# Patient Record
Sex: Female | Born: 1935 | Race: White | Hispanic: No | Smoking: Former smoker
Health system: Southern US, Community
[De-identification: ages and names within clinical notes are randomized; demographics above are authoritative.]

## PROBLEM LIST (undated history)

## (undated) DIAGNOSIS — F01518 Vascular dementia, unspecified severity, with other behavioral disturbance: Secondary | ICD-10-CM

## (undated) DIAGNOSIS — E1129 Type 2 diabetes mellitus with other diabetic kidney complication: Secondary | ICD-10-CM

## (undated) DIAGNOSIS — N186 End stage renal disease: Secondary | ICD-10-CM

## (undated) DIAGNOSIS — I5022 Chronic systolic (congestive) heart failure: Secondary | ICD-10-CM

## (undated) DIAGNOSIS — F0151 Vascular dementia with behavioral disturbance: Secondary | ICD-10-CM

## (undated) HISTORY — DX: Vascular dementia, unspecified severity, with other behavioral disturbance: F01.518

## (undated) HISTORY — DX: Chronic systolic (congestive) heart failure: I50.22

## (undated) HISTORY — DX: Vascular dementia with behavioral disturbance: F01.51

## (undated) HISTORY — DX: Type 2 diabetes mellitus with other diabetic kidney complication: E11.29

## (undated) HISTORY — DX: End stage renal disease: N18.6

---

## 1998-11-05 HISTORY — PX: PACEMAKER INSERTION: SHX728

## 2004-04-04 ENCOUNTER — Ambulatory Visit: Payer: Self-pay | Admitting: Internal Medicine

## 2005-06-26 ENCOUNTER — Ambulatory Visit: Payer: Self-pay | Admitting: Nephrology

## 2007-08-05 ENCOUNTER — Other Ambulatory Visit: Payer: Self-pay

## 2007-08-06 ENCOUNTER — Observation Stay: Payer: Self-pay | Admitting: Internal Medicine

## 2008-09-30 ENCOUNTER — Ambulatory Visit: Payer: Self-pay | Admitting: Family Medicine

## 2008-10-06 ENCOUNTER — Ambulatory Visit: Payer: Self-pay | Admitting: Family Medicine

## 2010-03-14 ENCOUNTER — Inpatient Hospital Stay: Payer: Self-pay | Admitting: Internal Medicine

## 2010-04-16 ENCOUNTER — Emergency Department: Payer: Self-pay | Admitting: Internal Medicine

## 2011-05-11 IMAGING — US US CAROTID DUPLEX BILAT
1 series · 17 of 24 positions shown · non-contrast
Comparison: none

REASON FOR EXAM: cva
COMMENTS:

[Series 1: us carotid duplex bilat · 17 of 62 slices shown]
[im 1/62]
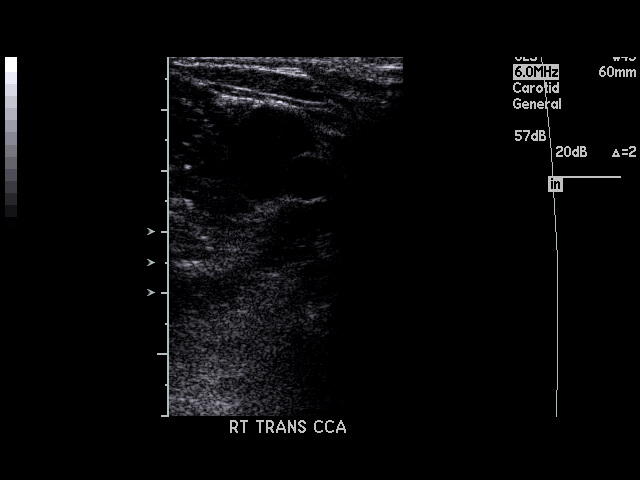
[im 6/62]
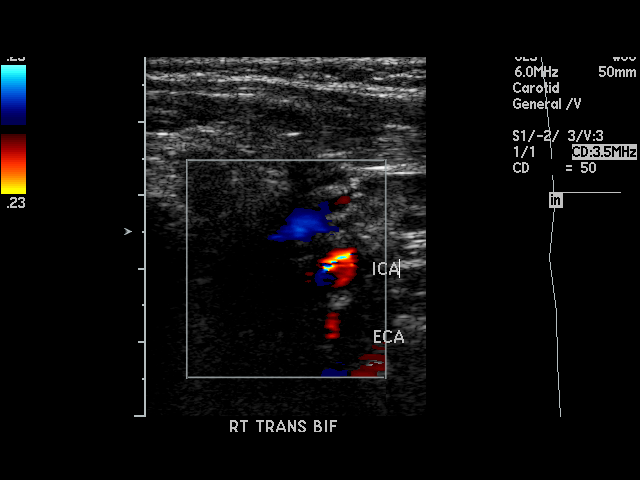
[im 8/62]
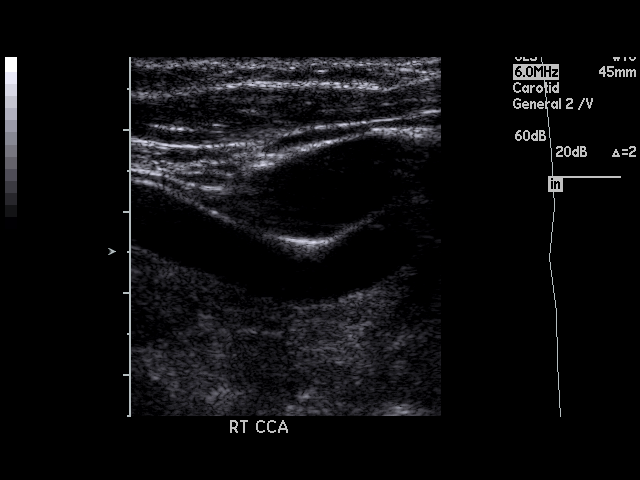
[im 11/62]
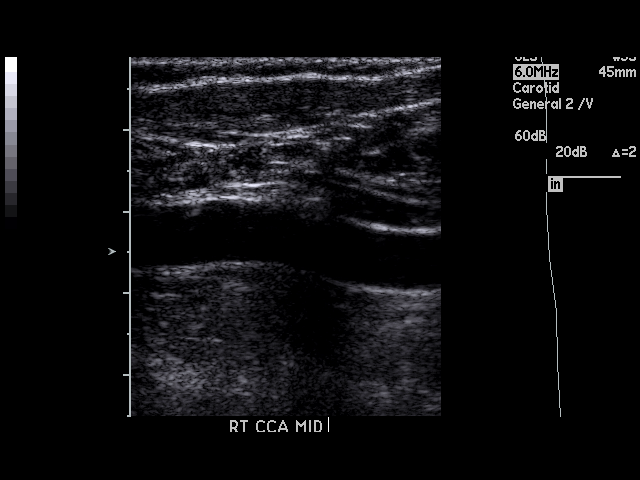
[im 16/62]
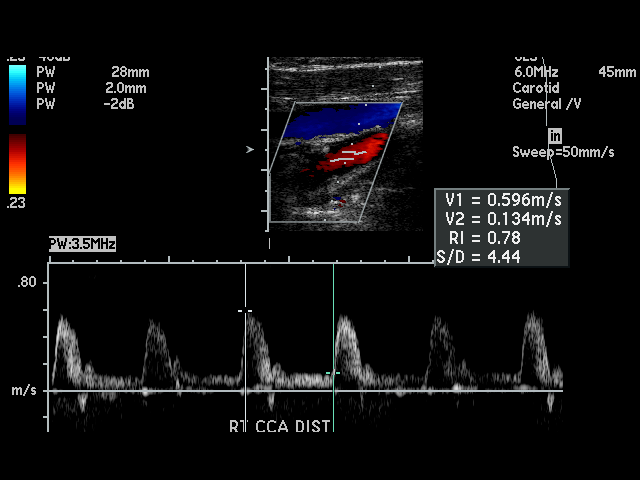
[im 19/62]
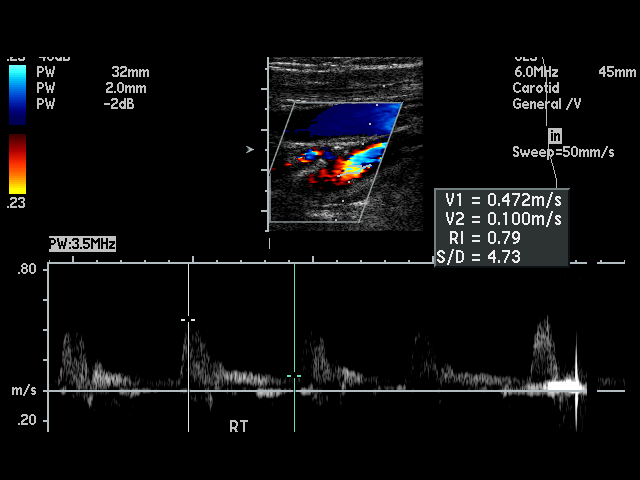
[im 24/62]
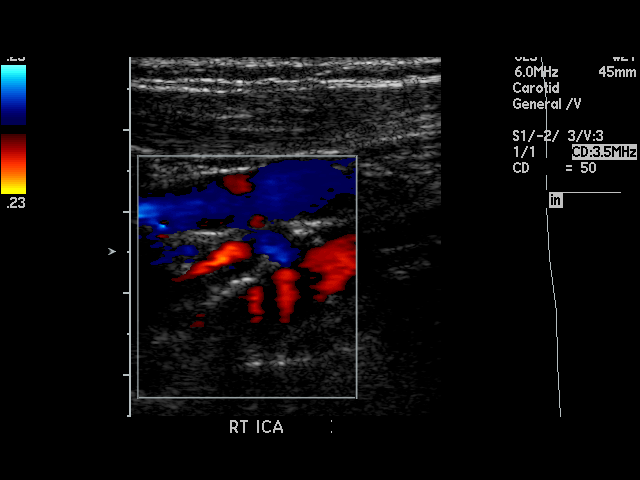
[im 27/62]
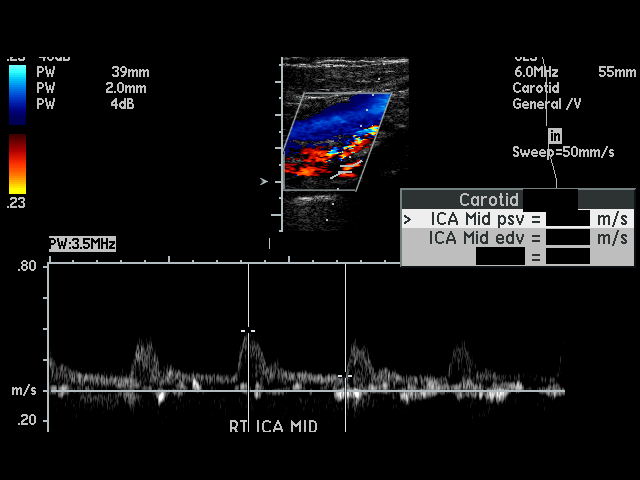
[im 32/62]
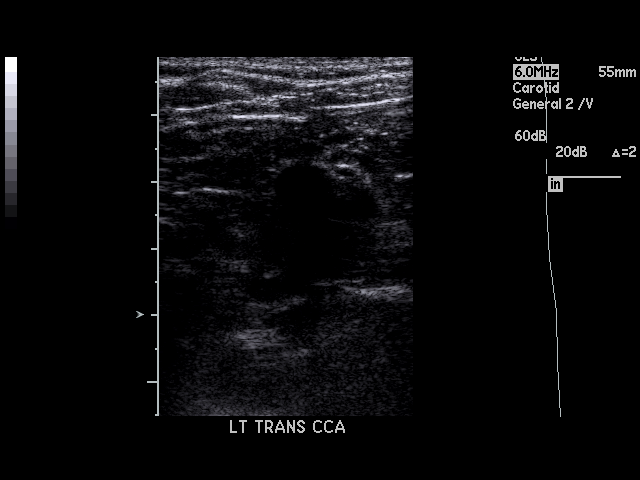
[im 35/62]
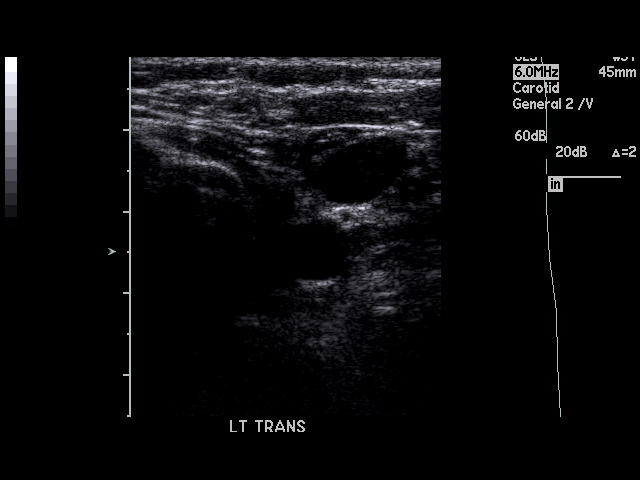
[im 38/62]
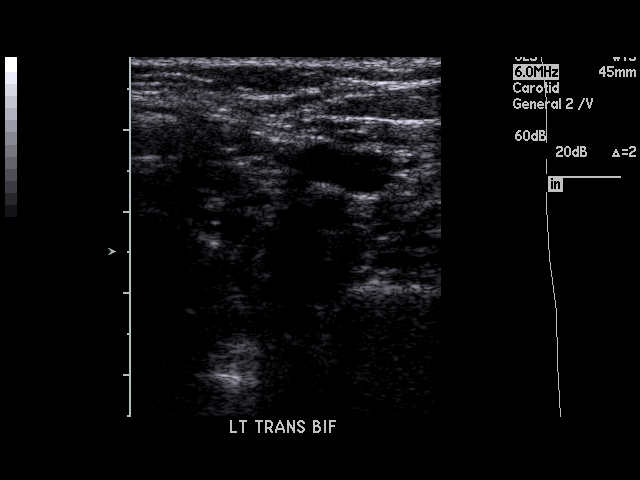
[im 43/62]
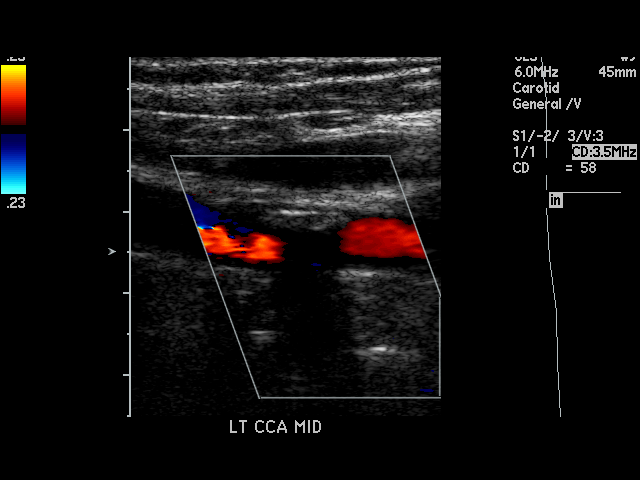
[im 46/62]
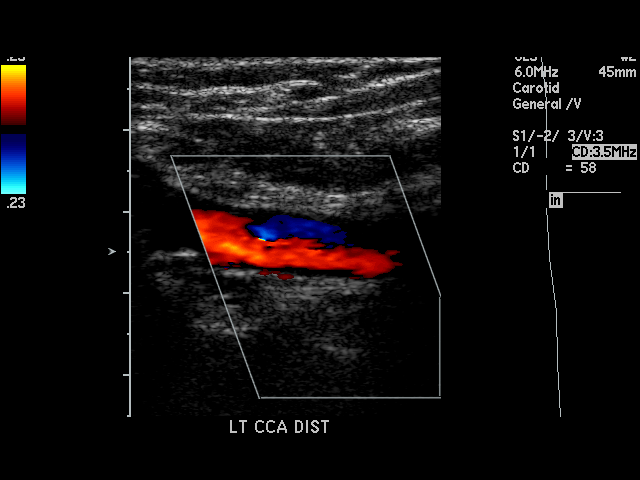
[im 51/62]
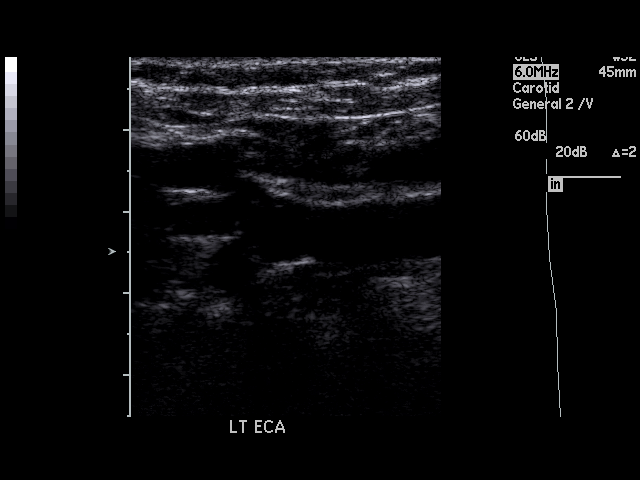
[im 54/62]
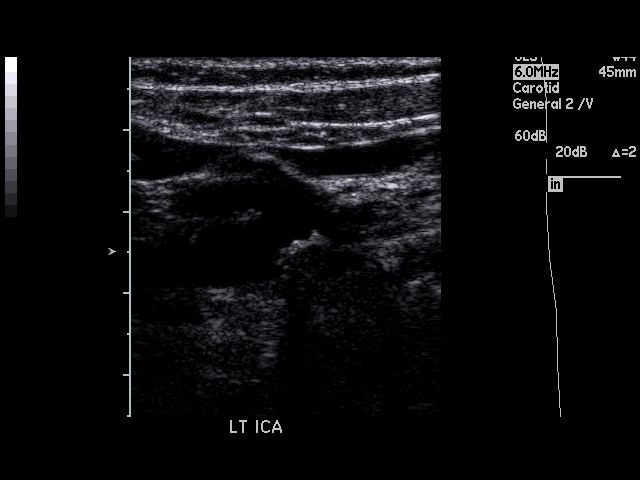
[im 56/62]
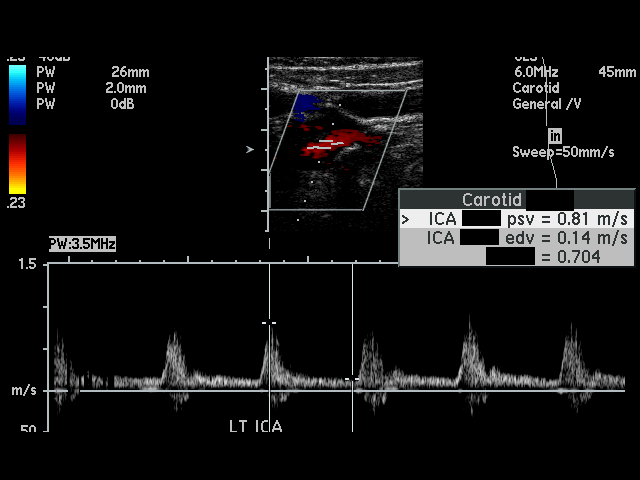
[im 62/62]
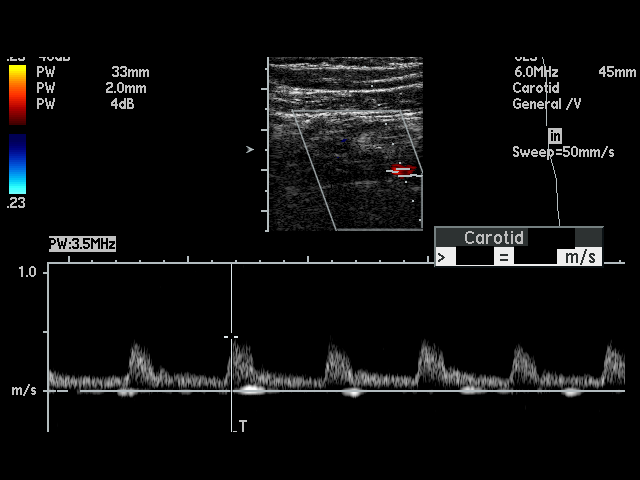

[17 of 24 positions shown; findings below may reference images not displayed]

PROCEDURE:     US  - US CAROTID DOPPLER BILATERAL  - March 16, 2010 [DATE]

RESULT:     Atherosclerotic calcification is present in the carotid bulb and
internal carotid arteries bilaterally without visual evidence of significant
stenosis. Antegrade flow is present on color and spectral Doppler images in
the carotid and vertebral arteries. No flow reversal is present. The peak
systolic velocities appear to be within normal limits. The internal to
common carotid peak systolic velocity ratio is 0.835 on the right and
on the left.
IMPRESSION: Atherosclerotic disease. No evidence of hemodynamically
significant stenosis.

## 2011-07-06 ENCOUNTER — Emergency Department: Payer: Self-pay | Admitting: Internal Medicine

## 2011-07-06 LAB — URINALYSIS, COMPLETE
Bilirubin,UR: NEGATIVE
Glucose,UR: NEGATIVE mg/dL (ref 0–75)
Hyaline Cast: 32
Ketone: NEGATIVE
Leukocyte Esterase: NEGATIVE
Nitrite: NEGATIVE
Ph: 6 (ref 4.5–8.0)
Protein: NEGATIVE
RBC,UR: 1 /HPF (ref 0–5)
Specific Gravity: 1.01 (ref 1.003–1.030)
Squamous Epithelial: 1
WBC UR: 1 /HPF (ref 0–5)

## 2011-07-06 LAB — COMPREHENSIVE METABOLIC PANEL
Albumin: 3 g/dL — ABNORMAL LOW (ref 3.4–5.0)
Alkaline Phosphatase: 95 U/L (ref 50–136)
Anion Gap: 6 — ABNORMAL LOW (ref 7–16)
BUN: 39 mg/dL — ABNORMAL HIGH (ref 7–18)
Bilirubin,Total: 0.7 mg/dL (ref 0.2–1.0)
Calcium, Total: 8.8 mg/dL (ref 8.5–10.1)
Chloride: 106 mmol/L (ref 98–107)
Co2: 30 mmol/L (ref 21–32)
Creatinine: 1.88 mg/dL — ABNORMAL HIGH (ref 0.60–1.30)
EGFR (African American): 29 — ABNORMAL LOW
EGFR (Non-African Amer.): 25 — ABNORMAL LOW
Glucose: 180 mg/dL — ABNORMAL HIGH (ref 65–99)
Osmolality: 297 (ref 275–301)
Potassium: 4.2 mmol/L (ref 3.5–5.1)
SGOT(AST): 15 U/L (ref 15–37)
SGPT (ALT): 16 U/L
Sodium: 142 mmol/L (ref 136–145)
Total Protein: 6.8 g/dL (ref 6.4–8.2)

## 2011-07-06 LAB — CBC
HCT: 32.9 % — ABNORMAL LOW (ref 35.0–47.0)
HGB: 11.4 g/dL — ABNORMAL LOW (ref 12.0–16.0)
MCH: 33 pg (ref 26.0–34.0)
MCHC: 34.6 g/dL (ref 32.0–36.0)
MCV: 95 fL (ref 80–100)
Platelet: 112 10*3/uL — ABNORMAL LOW (ref 150–440)
RBC: 3.46 10*6/uL — ABNORMAL LOW (ref 3.80–5.20)
RDW: 13.1 % (ref 11.5–14.5)
WBC: 7.5 10*3/uL (ref 3.6–11.0)

## 2011-07-06 LAB — TROPONIN I: Troponin-I: <0.02 ng/mL

## 2012-01-19 ENCOUNTER — Inpatient Hospital Stay: Payer: Self-pay | Admitting: Internal Medicine

## 2012-01-19 LAB — COMPREHENSIVE METABOLIC PANEL
Albumin: 3.4 g/dL (ref 3.4–5.0)
Anion Gap: 4 — ABNORMAL LOW (ref 7–16)
BUN: 58 mg/dL — ABNORMAL HIGH (ref 7–18)
Calcium, Total: 9.5 mg/dL (ref 8.5–10.1)
Chloride: 106 mmol/L (ref 98–107)
Co2: 32 mmol/L (ref 21–32)
Creatinine: 2.78 mg/dL — ABNORMAL HIGH (ref 0.60–1.30)
EGFR (African American): 18 — ABNORMAL LOW
EGFR (Non-African Amer.): 15 — ABNORMAL LOW
Osmolality: 303 (ref 275–301)
Potassium: 4.5 mmol/L (ref 3.5–5.1)
SGOT(AST): 24 U/L (ref 15–37)
SGPT (ALT): 17 U/L (ref 12–78)
Sodium: 142 mmol/L (ref 136–145)
Total Protein: 7.4 g/dL (ref 6.4–8.2)

## 2012-01-19 LAB — URINALYSIS, COMPLETE
Bilirubin,UR: NEGATIVE
Glucose,UR: NEGATIVE mg/dL (ref 0–75)
Hyaline Cast: 3
Ketone: NEGATIVE
Ph: 6 (ref 4.5–8.0)
Protein: NEGATIVE
RBC,UR: 35 /HPF (ref 0–5)
Squamous Epithelial: 4

## 2012-01-19 LAB — CBC
HGB: 11.8 g/dL — ABNORMAL LOW (ref 12.0–16.0)
MCH: 33.1 pg (ref 26.0–34.0)
MCHC: 34.4 g/dL (ref 32.0–36.0)
MCV: 96 fL (ref 80–100)

## 2012-01-20 LAB — CBC WITH DIFFERENTIAL/PLATELET
Basophil #: 0 10*3/uL (ref 0.0–0.1)
Basophil %: 0.9 %
Eosinophil #: 0.2 10*3/uL (ref 0.0–0.7)
Eosinophil %: 3.2 %
HCT: 27.9 % — ABNORMAL LOW (ref 35.0–47.0)
Lymphocyte #: 1.3 10*3/uL (ref 1.0–3.6)
MCH: 32 pg (ref 26.0–34.0)
MCV: 96 fL (ref 80–100)
Monocyte #: 0.4 x10 3/mm (ref 0.2–0.9)
Monocyte %: 7.9 %
Neutrophil #: 3.2 10*3/uL (ref 1.4–6.5)
RDW: 13.9 % (ref 11.5–14.5)
WBC: 5.2 10*3/uL (ref 3.6–11.0)

## 2012-01-20 LAB — MAGNESIUM: Magnesium: 1.8 mg/dL

## 2012-01-20 LAB — BASIC METABOLIC PANEL
Anion Gap: 5 — ABNORMAL LOW (ref 7–16)
Calcium, Total: 8.3 mg/dL — ABNORMAL LOW (ref 8.5–10.1)
Chloride: 112 mmol/L — ABNORMAL HIGH (ref 98–107)
Creatinine: 2.78 mg/dL — ABNORMAL HIGH (ref 0.60–1.30)
EGFR (Non-African Amer.): 15 — ABNORMAL LOW
Glucose: 140 mg/dL — ABNORMAL HIGH (ref 65–99)
Osmolality: 310 (ref 275–301)
Potassium: 4.5 mmol/L (ref 3.5–5.1)

## 2012-01-25 LAB — CULTURE, BLOOD (SINGLE)

## 2012-04-24 ENCOUNTER — Emergency Department: Payer: Self-pay | Admitting: Unknown Physician Specialty

## 2012-04-24 LAB — CBC
HCT: 37.7 % (ref 35.0–47.0)
HGB: 12.9 g/dL (ref 12.0–16.0)
MCH: 32.3 pg (ref 26.0–34.0)
MCHC: 34.2 g/dL (ref 32.0–36.0)
RBC: 3.99 10*6/uL (ref 3.80–5.20)
RDW: 12.7 % (ref 11.5–14.5)
WBC: 6.5 10*3/uL (ref 3.6–11.0)

## 2012-04-24 LAB — COMPREHENSIVE METABOLIC PANEL
Albumin: 3.6 g/dL (ref 3.4–5.0)
Alkaline Phosphatase: 119 U/L (ref 50–136)
Anion Gap: 5 — ABNORMAL LOW (ref 7–16)
Bilirubin,Total: 0.3 mg/dL (ref 0.2–1.0)
Calcium, Total: 9.3 mg/dL (ref 8.5–10.1)
Chloride: 104 mmol/L (ref 98–107)
EGFR (African American): 22 — ABNORMAL LOW
EGFR (Non-African Amer.): 19 — ABNORMAL LOW
Glucose: 260 mg/dL — ABNORMAL HIGH (ref 65–99)
SGOT(AST): 26 U/L (ref 15–37)
Total Protein: 7.7 g/dL (ref 6.4–8.2)

## 2012-04-24 LAB — TSH: Thyroid Stimulating Horm: 2.12 u[IU]/mL

## 2012-04-24 LAB — TROPONIN I: Troponin-I: <0.02 ng/mL

## 2012-04-24 LAB — LIPASE, BLOOD: Lipase: 375 U/L (ref 73–393)

## 2012-04-24 LAB — MAGNESIUM: Magnesium: 1.9 mg/dL

## 2012-06-07 ENCOUNTER — Encounter: Payer: Self-pay | Admitting: Internal Medicine

## 2012-06-07 DIAGNOSIS — E1129 Type 2 diabetes mellitus with other diabetic kidney complication: Secondary | ICD-10-CM | POA: Insufficient documentation

## 2012-06-07 DIAGNOSIS — I5022 Chronic systolic (congestive) heart failure: Secondary | ICD-10-CM | POA: Insufficient documentation

## 2012-06-07 DIAGNOSIS — N186 End stage renal disease: Secondary | ICD-10-CM | POA: Insufficient documentation

## 2012-06-12 ENCOUNTER — Ambulatory Visit: Admitting: Internal Medicine

## 2012-06-12 ENCOUNTER — Encounter: Payer: Self-pay | Admitting: Internal Medicine

## 2012-06-12 VITALS — BP 116/64 | HR 86 | Resp 17

## 2012-06-12 DIAGNOSIS — F0150 Vascular dementia without behavioral disturbance: Secondary | ICD-10-CM

## 2012-06-12 DIAGNOSIS — E1129 Type 2 diabetes mellitus with other diabetic kidney complication: Secondary | ICD-10-CM

## 2012-06-12 DIAGNOSIS — F0151 Vascular dementia with behavioral disturbance: Secondary | ICD-10-CM

## 2012-06-12 DIAGNOSIS — I5022 Chronic systolic (congestive) heart failure: Secondary | ICD-10-CM

## 2012-06-12 DIAGNOSIS — N186 End stage renal disease: Secondary | ICD-10-CM

## 2012-06-12 NOTE — Assessment & Plan Note (Signed)
NYHA class 3? Bed and chair bound and uses lift Comfortable without any exertion

## 2012-06-12 NOTE — Progress Notes (Signed)
Subjective:    Patient ID: JONATHAN KIRKENDOLL, female    DOB: 26-Jun-1927, 79 y.o.   MRN: [id number]  HPI Daughter here  Aram Beecham --hospice RN is here  Has history of CHF--this was initial hospice diagnosis Did have pacer put in for symptomatic bradycardia Chronic edema Dyspnea at times This goes back many years  Memory issues for many years Really worsened in past year Has had multiple strokes and TIA  Doesn't recognize her left side (neglect) Doesn't stand Transfers with lift Will fight care from family--- like changing her after incontinence Daughter will give xanax before caregivers come---twice a week aides give bed bath Totally incontinent Needs to be dressed, have hair combed. Resists brushing teeth Usually will eat after set up but sometimes needs to be fed  Has had renal insufficiency for some time Seen at kidney center in the past but then refused follow up ESRD is now diagnosis GFR ~20 Had considered a fistula in past but now not considering  Diabetes goes back ~15 years ago On oral meds at first Checks sugars 3 times per day---due to very volatile sugars No hypoglycemic reactions  Current Outpatient Prescriptions on File Prior to Visit  Medication Sig Dispense Refill  . aspirin 81 MG tablet Take 81 mg by mouth daily.      . bumetanide (BUMEX) 1 MG tablet Take 2 mg by mouth daily.      Marland Kitchen glipiZIDE (GLUCOTROL) 10 MG tablet Take 10 mg by mouth 2 (two) times daily before a meal.      . levothyroxine (SYNTHROID, LEVOTHROID) 50 MCG tablet Take 50 mcg by mouth daily before breakfast.       No current facility-administered medications on file prior to visit.    No Known Allergies  Past Medical History  Diagnosis Date  . End stage renal disease   . Chronic systolic heart failure   . Vascular dementia with behavioral disturbance   . Type II or unspecified type diabetes mellitus with renal manifestations, not stated as uncontrolled(250.40)     Past Surgical  History  Procedure Laterality Date  . Pacemaker insertion N/A 2000's    [facility]    Family History  Problem Relation Age of Onset  . Alzheimer's disease Mother   . Heart disease Father   . Depression Father   . Stroke Son   . Hyperlipidemia Son   . Heart disease Daughter   . Hyperlipidemia Daughter     History   Social History  . Marital Status: Divorced    Spouse Name: N/A    Number of Children: 3  . Years of Education: N/A   Occupational History  . Engineer, petroleum in school     retired   Social History Main Topics  . Smoking status: Not on file  . Smokeless tobacco: Not on file  . Alcohol Use: Not on file  . Drug Use: Not on file  . Sexually Active: Not on file   Other Topics Concern  . Not on file   Social History Narrative   Lives with daughter--[name]      Has living will   Daughter [name] and son have health care POA   Has DNR---reviewed and rewritten 06/12/12   No tube feeds   Review of Systems  Constitutional: Negative for diaphoresis and unexpected weight change.  HENT: Positive for hearing loss. Negative for dental problem.        Own teeth  Eyes:       Poor vision  Respiratory: Positive for cough and shortness of breath. Negative for chest tightness.   Cardiovascular: Negative for chest pain and palpitations.  Gastrointestinal: Negative for constipation and blood in stool.       Incontinent No obvious heartburn  Endocrine: Negative for cold intolerance and heat intolerance.  Genitourinary: Negative for dysuria and difficulty urinating.       Incontinent  Musculoskeletal: Negative for back pain and arthralgias.  Skin: Positive for wound. Negative for rash.       Old sacral ulcer--now has tunnel (has sinus tract without drainage) Some mild redness under abdominal folds---Gold Bond helps  Allergic/Immunologic: Negative for environmental allergies and immunocompromised state.  Neurological: Positive for weakness. Negative for syncope and headaches.        Generalized weakness--left is worse  Psychiatric/Behavioral: Negative for sleep disturbance and dysphoric mood. The patient is not nervous/anxious.        Appetite is down of late       Objective:   Physical Exam  Constitutional: She appears well-developed and well-nourished. No distress.  HENT:  Mouth/Throat: Oropharynx is clear and moist. No oropharyngeal exudate.  Eyes: EOM are normal.  Equal miotic pupils  Neck: Normal range of motion. No thyromegaly present.  Cardiovascular: Normal rate, regular rhythm and normal heart sounds.  Exam reveals no gallop.   No murmur heard. Pulmonary/Chest: Effort normal and breath sounds normal. No respiratory distress. She has no wheezes. She has no rales.  Abdominal: Soft. There is no tenderness.  Sensitive but not tender  Musculoskeletal:  Thick calves without pitting No distal pulses--toes slightly dusky   Lymphadenopathy:    She has no cervical adenopathy.  Neurological:  3+/5 strength throughout No obvious neglect but may have right homonymous hemianopsia  Psychiatric:  HOH but answers socially if I speak loud enough          Assessment & Plan:

## 2012-06-12 NOTE — Assessment & Plan Note (Signed)
Sugars fluctuating a lot Will stop the 70/30 coverage since that is probably causing the fluctuations Start low dose lantus and titrate to regularly keep sugars under 200 fasting Novolog only for emergencies ---if sugars over 400-500 or symptomatic with high sugar

## 2012-06-12 NOTE — Assessment & Plan Note (Signed)
May still be GFR over 15 No apparent symptoms--- nausea, itching, sedation, etc Will not seek dialysis or other renal replacement On hospice

## 2012-06-12 NOTE — Assessment & Plan Note (Signed)
Seems to be moderate cognitively but severe functional deficits Total care except feeds herself at times On hospice care

## 2012-07-19 DIAGNOSIS — F0390 Unspecified dementia without behavioral disturbance: Secondary | ICD-10-CM

## 2012-07-19 DIAGNOSIS — N186 End stage renal disease: Secondary | ICD-10-CM

## 2012-07-25 ENCOUNTER — Telehealth: Payer: Self-pay

## 2012-07-25 NOTE — Telephone Encounter (Signed)
Order given if she is concerned about UTI   If U/A suspicious I ordered empiric cipro 250mg  bid till culture back

## 2012-07-25 NOTE — Telephone Encounter (Signed)
Aram Beecham, nurse with [facility] left v/m pt is home visit of Dr Alphonsus Sias; Aram Beecham suspicious pt has UTI and request order faxed to [phone] or call Aram Beecham at [phone] for U/A.Please advise.

## 2012-08-07 ENCOUNTER — Encounter: Payer: Self-pay | Admitting: Internal Medicine

## 2012-08-13 ENCOUNTER — Ambulatory Visit: Payer: Self-pay | Admitting: Internal Medicine

## 2012-09-04 ENCOUNTER — Ambulatory Visit: Admitting: Internal Medicine

## 2012-09-04 ENCOUNTER — Encounter: Payer: Self-pay | Admitting: Internal Medicine

## 2012-09-04 VITALS — BP 108/78 | HR 94 | Resp 22

## 2012-09-04 DIAGNOSIS — F0150 Vascular dementia without behavioral disturbance: Secondary | ICD-10-CM

## 2012-09-04 DIAGNOSIS — L03319 Cellulitis of trunk, unspecified: Secondary | ICD-10-CM

## 2012-09-04 DIAGNOSIS — I5022 Chronic systolic (congestive) heart failure: Secondary | ICD-10-CM

## 2012-09-04 DIAGNOSIS — E1129 Type 2 diabetes mellitus with other diabetic kidney complication: Secondary | ICD-10-CM

## 2012-09-04 DIAGNOSIS — L02219 Cutaneous abscess of trunk, unspecified: Secondary | ICD-10-CM

## 2012-09-04 DIAGNOSIS — N186 End stage renal disease: Secondary | ICD-10-CM

## 2012-09-04 DIAGNOSIS — F0151 Vascular dementia with behavioral disturbance: Secondary | ICD-10-CM

## 2012-09-04 MED ORDER — ERGOCALCIFEROL 1.25 MG (50000 UT) PO CAPS: 50000.0000 [IU] | ORAL_CAPSULE | ORAL | Status: AC

## 2012-09-04 MED ORDER — CEPHALEXIN 500 MG PO TABS: 500.0000 mg | ORAL_TABLET | Freq: Three times a day (TID) | ORAL | Status: DC

## 2012-09-04 NOTE — Assessment & Plan Note (Signed)
Severe Total care Poor eating with nutritional decline Agitation with some response to alprazolam before bed baths, etc

## 2012-09-04 NOTE — Assessment & Plan Note (Signed)
Still has 500-1100cc of urine a day On ergocalciferol Supportive care---no plans for renal replacement On hospice

## 2012-09-04 NOTE — Assessment & Plan Note (Signed)
With ulcer at site of past pilonidal cyst Will treat with antibiotic Calcium alginate dressings till the ulcer granulates more

## 2012-09-04 NOTE — Assessment & Plan Note (Addendum)
Doesn't really seem to be fluid overloaded If dyspnea---will elevated the HOB, morphine We can increase her diuretics if persistent problems

## 2012-09-04 NOTE — Progress Notes (Signed)
Subjective:    Patient ID: Veronica Howe, female    DOB: 06-19-1927, 79 y.o.   MRN: [id number]  HPI Daughter and husband here Veronica Howe ---hospice nurse  Has had problems with stool impaction Tiny balls and liquid Used 3 doses of sorbitol 2 hours apart 5 days ago and she started moving Now with frequent loose stools Discussed using daily Having skin breakdown on butttocks now due to irritation. Using dermacloud  Now has indwelling Foley (seems to have urinary retention) Did have 1 week course of cipro when foley got clogged and changed  Bedbound completely Would list in chair anyway Hospice aides twice a week-- bed baths Gets the xanax before they come--or she will bite and pinch  Doesn't recognize anyone---even daughter Gets agitated easily  Now has hole where pilonidal cyst removed decades ago Red, irritated, drainage Just trying to keep it clean at this point  Check sugars once a day Usually around 200 No hypoglycemia No novolog in some time  Seems to have fluid issues Early May she had difficulty breathing ?gave morphine then Still intermittent wheezing---better if head of bed raised Not compliant with keeping oxygen on Seems to have abdominal fullness but not clearly dependent edema  Current Outpatient Prescriptions on File Prior to Visit  Medication Sig Dispense Refill  . ALPRAZolam (XANAX) 0.5 MG tablet Take 0.5 mg by mouth 3 (three) times daily as needed for anxiety.       Marland Kitchen aspirin 81 MG tablet Take 81 mg by mouth daily.      . bumetanide (BUMEX) 1 MG tablet Take 2 mg by mouth daily.      . Cranberry-Vitamin C-Probiotic (AZO CRANBERRY PO) Take 1 tablet by mouth daily. To prevent UTIs      . glipiZIDE (GLUCOTROL) 10 MG tablet Take 10 mg by mouth 2 (two) times daily before a meal.      . insulin aspart (NOVOLOG) 100 UNIT/ML injection Inject 2-6 Units into the skin 3 (three) times daily as needed for high blood sugar. For sugars over 400      . insulin  glargine (LANTUS) 100 UNIT/ML injection Inject 5 Units into the skin daily.      Marland Kitchen levothyroxine (SYNTHROID, LEVOTHROID) 50 MCG tablet Take 50 mcg by mouth daily before breakfast.       No current facility-administered medications on file prior to visit.    No Known Allergies  Past Medical History  Diagnosis Date  . End stage renal disease   . Chronic systolic heart failure   . Vascular dementia with behavioral disturbance   . Type II or unspecified type diabetes mellitus with renal manifestations, not stated as uncontrolled(250.40)     Past Surgical History  Procedure Laterality Date  . Pacemaker insertion N/A 2000's    [facility]    Family History  Problem Relation Age of Onset  . Alzheimer's disease Mother   . Heart disease Father   . Depression Father   . Stroke Son   . Hyperlipidemia Son   . Heart disease Daughter   . Hyperlipidemia Daughter     History   Social History  . Marital Status: Divorced    Spouse Name: N/A    Number of Children: 3  . Years of Education: N/A   Occupational History  . Engineer, petroleum in school     retired   Social History Main Topics  . Smoking status: Former Smoker    Quit date: 03/06/1978  . Smokeless tobacco: Never Used  .  Alcohol Use: No  . Drug Use: Not on file  . Sexually Active: Not on file   Other Topics Concern  . Not on file   Social History Narrative   Lives with daughter--Veronica Howe      Has living will   Daughter Veronica Howe and son have health care POA   Has DNR---reviewed and rewritten 06/12/12   No tube feeds   Review of Systems Has had muscle wasting Appetite was really bad when impacted---did take ensure/gatorade Ate a little yesterday Has probably lost some more weight     Objective:   Physical Exam  Constitutional: She appears well-developed. No distress.  Neck: No thyromegaly present.  Cardiovascular: Normal rate and regular rhythm.  Exam reveals no gallop.   No murmur heard. Pulmonary/Chest: Effort normal  and breath sounds normal. No respiratory distress. She has no wheezes. She has no rales.  Abdominal: Soft. There is no tenderness.  Musculoskeletal: She exhibits no edema.  Lymphadenopathy:    She has no cervical adenopathy.  Skin:  Open area at pilonidal area--mildly inflamed with some discharge  Psychiatric:  Doesn't really engage other than to push away my hand during exam No agitation though          Assessment & Plan:

## 2012-09-04 NOTE — Assessment & Plan Note (Signed)
Reasonable control with low dose lantus Would not press dosing since palliative approach (just trying to avoid severe hyperglycemia)

## 2012-09-13 ENCOUNTER — Telehealth: Payer: Self-pay | Admitting: Internal Medicine

## 2012-09-13 MED ORDER — INSULIN GLARGINE 100 UNIT/ML ~~LOC~~ SOLN: 10.0000 [IU] | Freq: Every day | SUBCUTANEOUS | Status: DC

## 2012-09-13 MED ORDER — INSULIN ASPART 100 UNIT/ML ~~LOC~~ SOLN: 4.0000 [IU] | Freq: Four times a day (QID) | SUBCUTANEOUS | Status: AC | PRN

## 2012-09-13 NOTE — Telephone Encounter (Signed)
Discussed with Aram Beecham RN from hospice Still getting high sugars Called over weekend with [phone] Orders given  Still running high Lowest fasting ~200  Will increase lantus to 10 and the coverage amount

## 2012-09-16 DIAGNOSIS — F0390 Unspecified dementia without behavioral disturbance: Secondary | ICD-10-CM

## 2012-09-16 DIAGNOSIS — N186 End stage renal disease: Secondary | ICD-10-CM

## 2012-09-16 DIAGNOSIS — I509 Heart failure, unspecified: Secondary | ICD-10-CM

## 2012-10-18 ENCOUNTER — Telehealth: Payer: Self-pay | Admitting: Internal Medicine

## 2012-10-18 NOTE — Telephone Encounter (Signed)
PC from [facility], hospice nurse On the antibiotic but sugars still running very high Over 500 at times  Will increase the lantus to 20 units daily, and increase further next week if needed Continue same coverage Drinking diabetic boost---she will check with pharmacist to be sure that is comparable to glucerna

## 2012-10-24 NOTE — Telephone Encounter (Signed)
Have her increase the lantus to 30 units daily  It may be better to only regularly check sugars in the morning  Other times may be too much information

## 2012-10-24 NOTE — Telephone Encounter (Signed)
Spoke with hospice nurse and advised results  

## 2012-10-24 NOTE — Telephone Encounter (Signed)
Cindy with [facility] said BS still unstable in afternoon; BS in afternoon 400-500's range. Pt has been taking lantus 20 units daily. FBS are in mid 200s. Pt is still on antibiotic. Pt is nonverbal with dementia.Please advise.

## 2012-11-04 DEATH — deceased

## 2013-06-19 IMAGING — CR DG CHEST 1V PORT
1 series · 2 of 2 positions shown · non-contrast
Comparison: none

REASON FOR EXAM: weakness
COMMENTS:

[Series 1: ap · 0.17mm/px · 2 of 2 slices shown]
[im 1/2]
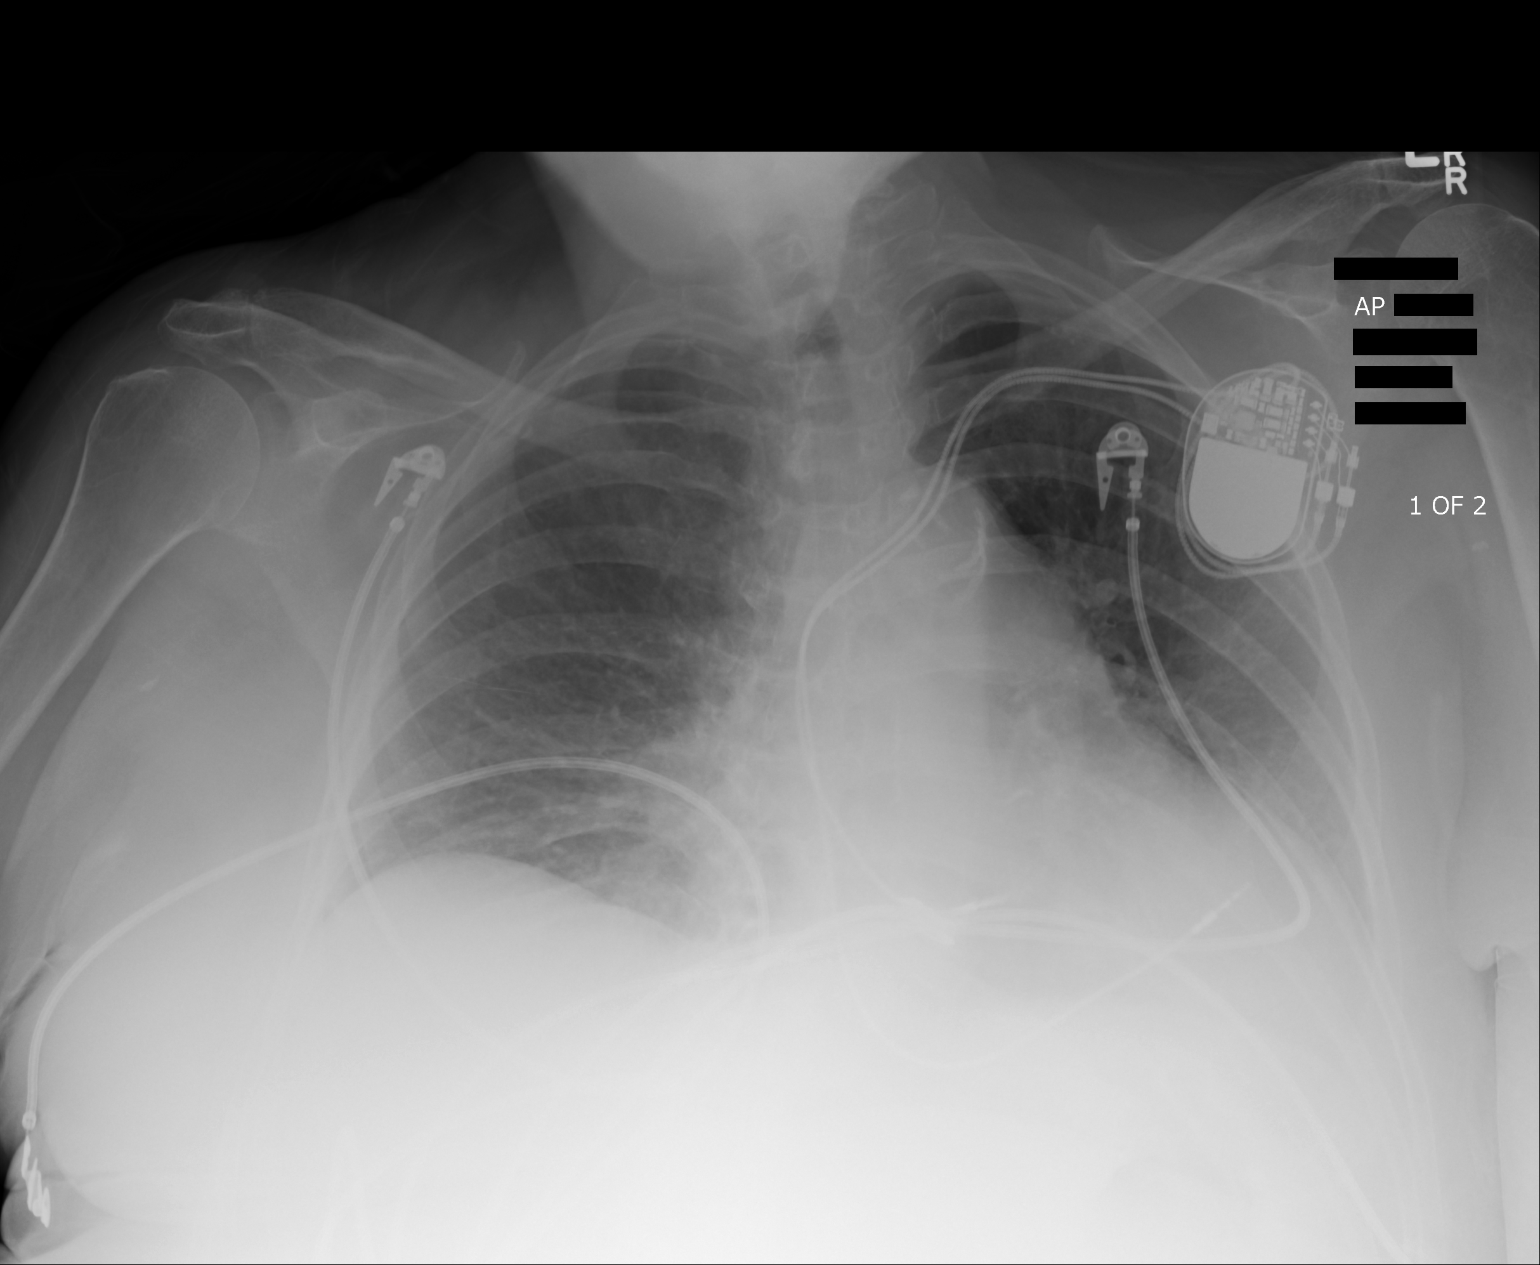
[im 2/2]
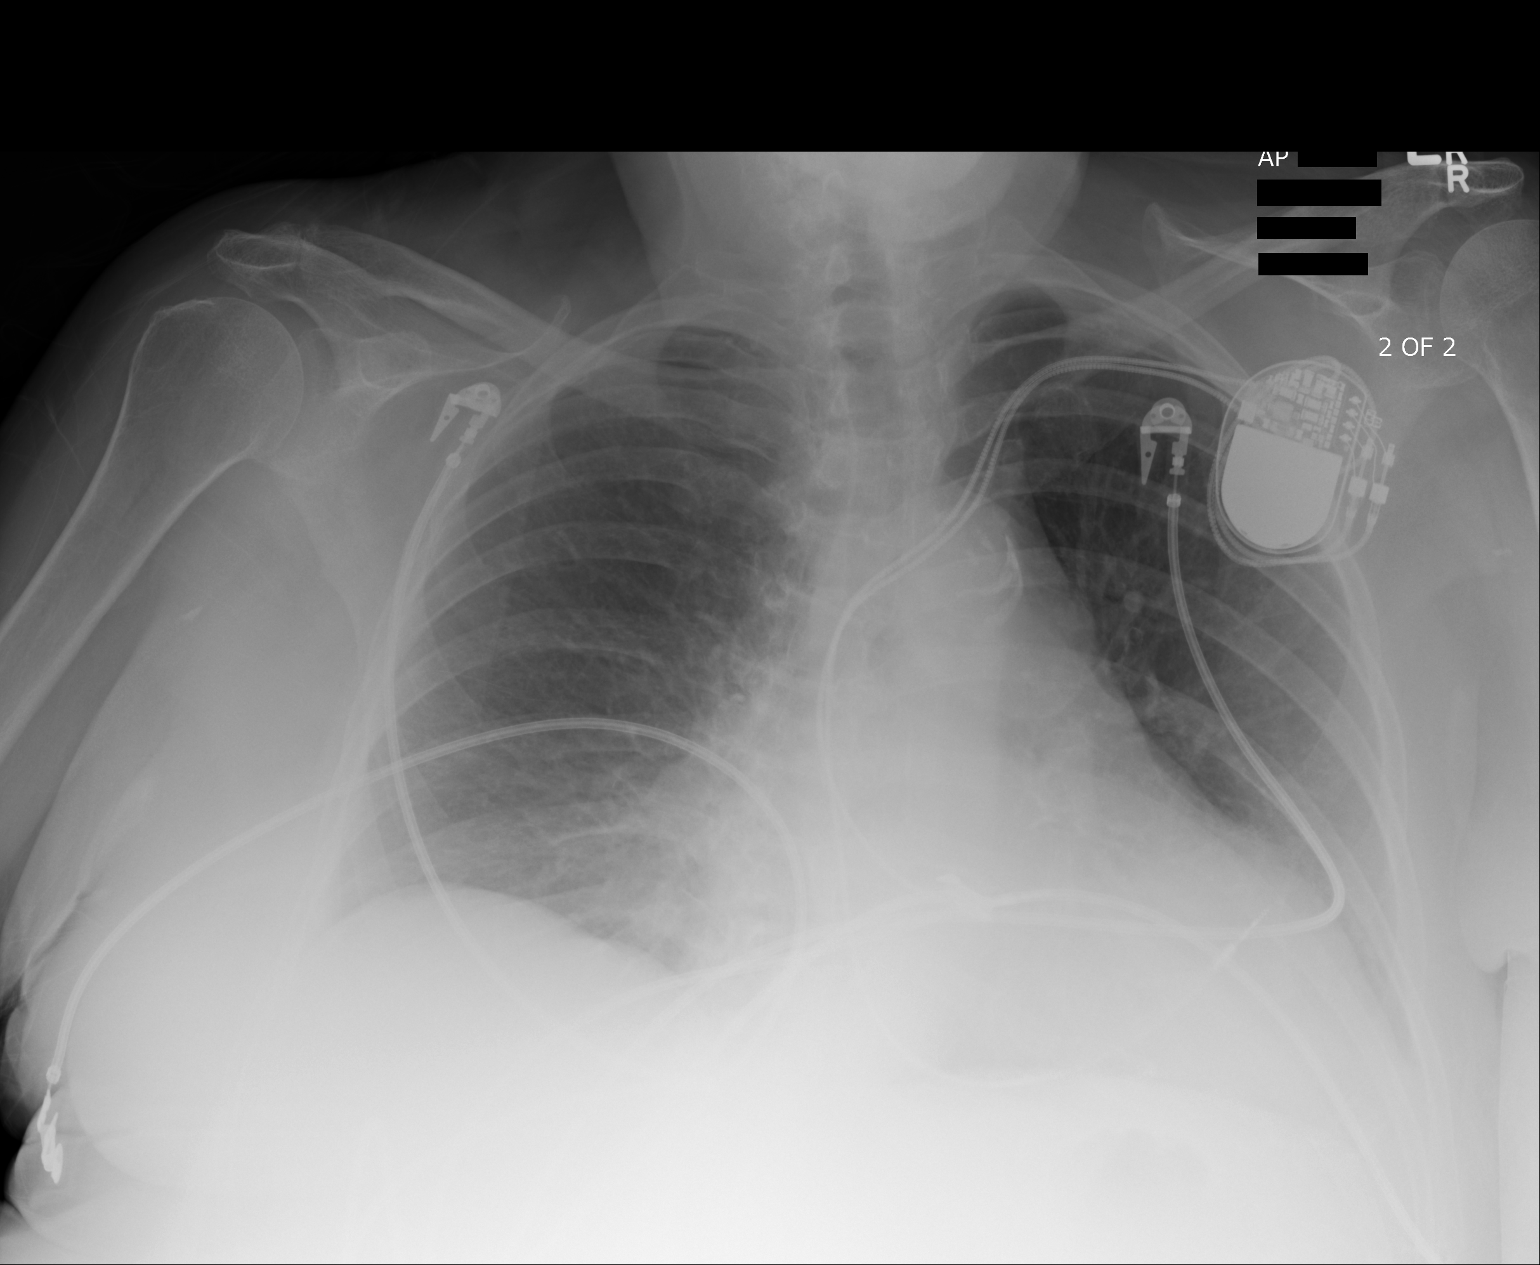

[2 of 2 positions shown; findings below may reference images not displayed]

PROCEDURE:     DXR - DXR PORTABLE CHEST SINGLE VIEW  - April 24, 2012  [DATE]

RESULT:     Comparison is made to the study of to July 2011. There is a
left-sided pacemaker present. The heart is mildly enlarged. There is diffuse
interstitial prominence which could represent an edematous or nonedematous
infiltrate. Small left pleural effusion and some left lung base presumed
atelectasis are noted. Some right lung base atelectasis is also present.
IMPRESSION: Pacemaker present with stable cardiomegaly and bilateral
lung base areas of presumed atelectasis. Likely small left pleural effusion.

[REDACTED]

## 2014-06-23 NOTE — Discharge Summary (Signed)
PATIENT NAME:  Veronica Howe, Veronica Howe MR#:  [id number] DATE OF BIRTH:  1927-12-16  DATE OF ADMISSION:  01/19/2012 DATE OF DISCHARGE:  01/21/2012  DISCHARGE DIAGNOSES:  1. Altered mental status possibly due to urinary tract infection. 2. Possible urinary tract infection.  3. Chronic kidney disease. 4. Mild dehydration.  5. Hypertension.  6. Anemia.  7. Diabetes.  8. Mild hypernatremia. 9. Hypothyroidism. 10. Chronic systolic congestive heart failure. 11. Sick sinus syndrome, status post pacemaker.   DISPOSITION: The patient is being discharged home.   DISCHARGE FOLLOWUP: Follow up with PCP, Dr. Sherrie MustacheFisher, in 1 to 2 weeks after discharge.   DISCHARGE DIET: Low sodium, 1800 calorie ADA diet.   DISCHARGE ACTIVITY: As tolerated.   DISCHARGE MEDICATIONS:  1. Levaquin 250 mg daily for three days.  2. Aspirin 81 mg daily.  3. Simvastatin 40 mg daily.  4. Vitamin D3 50,000 international units once a month.  5. Multivitamin once a day.  6. Dermacloud cream applied to affected area six times a day.  7. Tylenol 325 mg every 4 hours p.r.n. pain.  8. Glipizide 5 mg twice a day. 9. Synthroid 88 mcg daily.  10. Senokot 1 tablet daily.  11. NovoLog 70/30 sliding scale.  12. Bumex 1 mg two tablets once a day for swelling. The patient's daughter has been advised to check her blood pressure before giving her the medication.   RESULTS: Microbiology: Blood and urine cultures no growth so far.   Glucose 167, BUN 58, creatinine 2.78, and sodium 142. The rest of electrolytes are normal. TSH 5.08. White count 6.5, hemoglobin 11.8, hematocrit 34.5, and platelet count 116.   HOSPITAL COURSE: The patient is an 81 year old female with past medical history of dementia, sick sinus syndrome, diabetes, hypertension, and Alzheimer's dementia who was brought in by her family for weakness and increasing confusion. The patient was diagnosed with a urinary tract infection as an outpatient and had been started on oral  Cipro. The patient's encephalopathy was felt to be possibly due to urinary tract infection. With conservative management, she has returned back to her baseline. Blood and urine cultures were sent and the patient was empirically started on Rocephin. Her cultures have been negative so far, but she was taking Cipro as an outpatient. She will continue oral antibiotics as an outpatient. The patient appears to have chronic kidney disease, which is at baseline. She had mild dehydration which resolved with IV fluids. The patient was on multiple antihypertensive medications, but her blood pressure was low on admission. Therefore, all her antihypertensive medications were placed on hold with improvement in her blood pressure. The patient's family has been informed that she likely does not require any further hypertensive medications and if she does get antihypertensive medications it can drop her blood pressure and cause increasing confusion. She had some anemia of on chronic disease which was compounded by hemodilution. She also has chronic thrombocytopenia. The patient's oral intake was very variable and her early morning Accu-Cheks were around 67. Therefore, the dose of her glipizide has been reduced to prevent hypoglycemia. Her TSH was elevated. Therefore, the dose of her Synthroid has been increased. Her chronic systolic congestive heart failure remained stable throughout the hospitalization. She is being discharged home in stable condition.   CODE STATUS: DO NOT RESUSCITATE.   TIME SPENT: 45 minutes.  ____________________________ Darrick MeigsSangeeta Taydon Nasworthy, MD sp:slb D: 01/21/2012 14:24:13 ET T: 01/22/2012 11:08:05 ET JOB#: [id number]  cc: Darrick MeigsSangeeta Oluwasemilore Pascuzzi, MD, <Dictator> Demetrios Isaacsonald E. Sherrie MustacheFisher, MD Darrick MeigsSANGEETA Donnovan Stamour MD ELECTRONICALLY SIGNED  01/22/2012 14:11 

## 2014-06-23 NOTE — H&P (Signed)
PATIENT NAME:  Veronica Howe, Veronica Howe MR#:  [id number] DATE OF BIRTH:  Apr 02, 1927  DATE OF ADMISSION:  01/19/2012  REASON FOR ADMISSION: Altered mental status due to urinary tract infection, dehydration and acute kidney injury.   PRIMARY CARE PHYSICIAN: Mila Merry, MD  REFERRING PHYSICIAN: Governor Rooks, MD   HISTORY OF PRESENT ILLNESS: Veronica Howe is a very nice 81 year old female who has history of congestive heart failure, systolic dysfunction. She had a permanent pacemaker due to sick sinus rhythm, diabetes, hypertension, hyperlipidemia, hypothyroidism, chronic renal insufficiency, and Alzheimer dementia. At this moment she lives with her daughter who takes really good care of her. She is very active, gets around although she is always short of breath due to her congestive heart failure. She walks with a walker.   Comes today with a history of being really confused for the past day. Her daughter is concerned because the patient does not understand what she has been told. The patient does not follow any of her commands and this morning when she got up to go to the bathroom with the help of her daughter she did not know what to do. She was trying to sit down on the floor instead of the toilet. She could not really walk back. The daughter was really concerned and called EMS who went to the house and helped in picking her up from the floor where she sat. Apparently she has been having some increasing confusion but not as quick as what happened today. She denies any fever, but her urine has been very smelly. Patient is just getting considerably weaker.   Here in the ER she was evaluated. Her confusion is still there and she was found to have elevated  white blood cells on urine and very thick urine, very odorous.   Her vital signs have been stable, and she is stable to go to the floor.   REVIEW OF SYSTEMS: Unable to get a good review of systems due to the patient's confusion and dementia. I was able to  get some information from the daughter. She states that constitutionally there has not been fever or changes in her weight. ENT: No changes in her vision. No nasal congestion. RESPIRATORY: Patient has chronic cough, but no changes on that status. There is no wheezing or congestion. CARDIOVASCULAR: Patient has severe chronic obstructive pulmonary disease and has exertional dyspnea. GASTROINTESTINAL: No nausea or vomiting. No diarrhea. Her bowel movements are normal. GI: As mentioned above. Increased frequency with odorous urine. ENDOCRINE: The patient has diabetes. She does not follow a diet because she barely eats so the daughter wants to give her regular food which I agree with. HEME/LYMPH: No anemia. No bleeding. SKIN: No rashes or lesions. MUSCULOSKELETAL: No mayor pains in joints, no gout. NEUROLOGIC: Positive for severe dementia. No ataxia. No significant numbness on lower extremities, but she does have diabetic neuropathy with pain when she is touched.  PSYCHIATRIC: No anxiety.   PAST MEDICAL HISTORY:  1. Hypertension.  2. Hyperlipidemia.  3. Hypothyroidism.  4. Non-insulin-dependent diabetes, well controlled.  5. Systolic dysfunction, congestive heart failure with ejection fraction of 40%.  6. Sick sinus syndrome, status post pacemaker.  7. Chronic renal insufficiency.  8. Alzheimer's dementia.   SOCIAL HISTORY: The patient lives with her daughter who takes care of her. She is on Hospice due to her congestive heart failure and she has oxygen at home but only on a p.r.n. basis when she walks. She used to smoke more than pack a day  for over 35 years. She quit at the age of 14. She does not drink. She has never used illegal drugs.  ALLERGIES: No known drug allergies.   FAMILY HISTORY: Checked with the daughter.  Her father and her mother and her daughter had diabetes. There is no other cancer or heart disease as far as she knows.   SURGICAL HISTORY:  Denies any major surgeries other than  pacemaker.   MEDICATIONS:  1. Vitamin D 50,000 units once a month.  2. Simvastatin 40 mg a day. 3. Senokot once a day. 4. Multivitamins once a day.  5. NovoLog 70/30 for sliding scale only but she has not been using it due to low sugars. 6. Metoprolol 12.5 mg once daily.  7. Losartan 100 mg once daily.  8. Levothyroxine 50 mg once daily.  9. Glipizide 10 mg twice daily.  10. Dermacloud cream.  11. Bumetanide 1 mg once daily.  12. Ciprofloxacin was previously prescribed and not currently taking. 13. Aspirin 81 mg once a day.  14. Tylenol p.r.n. pain.   PHYSICAL EXAMINATION:  VITAL SIGNS: Temperature 98, blood pressure 116/69, pulse 89, respiratory rate 18.   GENERAL: Patient is alert, pleasantly confused. No acute distress. No respiratory distress.  Hemodynamically she is stable. She is comfortable, lying on bed, covered with blankets.   HEENT: Her pupils are equal and reactive. Extraocular movements are intact. Mucosa are very dry. Anicteric sclerae. Pink conjunctivae. No oral lesions. No oropharyngeal exudates.   NECK: Supple. No JVD. No thyromegaly. No adenopathy. No carotid bruits.   CARDIOVASCULAR: Regular rate and rhythm. No murmurs, rubs, or gallops. No displacement of PMI. No thrills.   LUNGS: Clear without any wheezing or crepitus. No use of accessory muscles. No dullness to percussion.   ABDOMEN: Soft, nontender, nondistended. No hepatosplenomegaly. No masses. Bowel sounds are positive.   GENITAL: Negative for external lesions.  No rash in groin.   EXTREMITIES: No edema, no cyanosis, no clubbing. Tenderness to palpation at the level of pretibial areas and feet due to diabetic neuropathy.   SKIN: Without any rashes or ulcers.   PSYCHIATRIC: Mood is normal affect. The patient smiles, pleasantly confused.   NEUROLOGIC: Cranial nerves II through XII intact. Strength is 5 out of 5 in four extremities. The patient is oriented to person mostly.  She knows that she is in the  hospital but she does not know who her daughter is. She thinks that actually her daughter is her mother. She cannot say her name.   LYMPHATICS: Negative for lymphadenopathy in neck or supraclavicular areas.   MUSCULOSKELETAL: Negative for tenderness to palpation of the back. Costovertebral angle tenderness negative. No joint effusions or edema.   RESULTS: Glucose 167, BUN 58, creatinine 1.78. Previous admission was down to 1.88.  Her  baseline is in between 2 to 2.3. Potassium 4.5. LFTs within normal limits. White cells 6.5, hemoglobin 11.8, platelets are 116,000 and they are chronically decreased. Urinalysis shows 108 white blood cells, 35 red blood cells, high leukocyte esterase. It is yellow and cloudy. EKG, pacemaker.   ASSESSMENT AND PLAN: An 81 year old female with history of hypertension, hyperlipidemia, hypothyroidism, diabetes, congestive heart failure, permanent pacemaker who comes to the hospital due to changes in her mental status, mostly confused and lethargic. The patient has a urinalysis that looks like a urine infection for what she was started on Rocephin. 1. Altered mental status. The patient has confusion and that has been increasing for the past 24 hours. The patient has not  been eating. She got really dehydrated. Likely confusion is due to her urinary tract infection for what we are going to treat and keep her here in the hospital. 2. Urinary tract infection. The patient has elevated white blood cells and very smelly, odorous, cloudy and thick urine for what she is going to be treated with Rocephin.  3. Follow up on cultures, blood cultures and urine culture.  4. Dehydration. The patient looks very dehydrated. Has really not been eating or drinking much. We are going to give her IV fluids gently due to the fact that she has severe congestive heart failure with an ejection fraction of 40%.    5. Acute kidney injury. The patient has a baseline creatinine that has been around 2 to 2.3.  Last creatinine was 1.88 on her last admission. Her daughter does not know exactly what is normal number; she believes it is close to 2. Her creatinine is 2.7. We are going to give her gentle IV fluids.  6. Congestive heart failure, systolic dysfunction. Patient to continue her medications including her metoprolol, simvastatin, aspirin. We are going to hold on her ARP, losartan, due to increased kidney function.  7. Diabetes. Keep her on insulin sliding scale. She is going to have regular diet because she apparently rarely eats, but we are going to check the blood sugars and treat her elevated blood sugars if they are above 150.  8. Deep vein thrombosis prophylaxis with heparin.  Gastrointestinal prophylaxis with Protonix.   CODE STATUS: THE PATIENT IS A DO NOT RESUSCITATE.  TIME SPENT:  I spent 40 minutes with this admission.      ____________________________ Felipa Furnaceoberto Sanchez Gutierrez, MD rsg:vtd D: 01/19/2012 18:32:01 ET T: 01/20/2012 07:47:28 ET JOB#: [id number]  cc: Felipa Furnaceoberto Sanchez Gutierrez, MD, <Dictator> Demetrios Isaacsonald E. Sherrie MustacheFisher, MD Pearletha FurlOBERTO SANCHEZ GUTIERRE MD ELECTRONICALLY SIGNED 01/21/2012 20:29
